# Patient Record
Sex: Female | Born: 1935 | Race: White | Hispanic: No | Smoking: Never smoker
Health system: Southern US, Community
[De-identification: ages and names within clinical notes are randomized; demographics above are authoritative.]

## PROBLEM LIST (undated history)

## (undated) DIAGNOSIS — E119 Type 2 diabetes mellitus without complications: Secondary | ICD-10-CM

## (undated) DIAGNOSIS — I4891 Unspecified atrial fibrillation: Secondary | ICD-10-CM

## (undated) DIAGNOSIS — I639 Cerebral infarction, unspecified: Secondary | ICD-10-CM

---

## 2004-04-24 ENCOUNTER — Ambulatory Visit: Payer: Self-pay | Admitting: Internal Medicine

## 2004-05-22 ENCOUNTER — Ambulatory Visit: Payer: Self-pay | Admitting: Internal Medicine

## 2004-06-20 ENCOUNTER — Ambulatory Visit: Payer: Self-pay | Admitting: Unknown Physician Specialty

## 2005-02-13 ENCOUNTER — Ambulatory Visit: Payer: Self-pay | Admitting: Rheumatology

## 2005-12-02 ENCOUNTER — Ambulatory Visit: Payer: Self-pay | Admitting: Internal Medicine

## 2006-08-26 ENCOUNTER — Other Ambulatory Visit: Payer: Self-pay

## 2006-08-26 ENCOUNTER — Emergency Department: Payer: Self-pay | Admitting: Emergency Medicine

## 2006-08-28 ENCOUNTER — Ambulatory Visit: Payer: Self-pay | Admitting: Emergency Medicine

## 2006-08-28 ENCOUNTER — Ambulatory Visit: Payer: Self-pay | Admitting: Internal Medicine

## 2006-09-11 ENCOUNTER — Ambulatory Visit: Payer: Self-pay | Admitting: Neurology

## 2008-01-07 ENCOUNTER — Ambulatory Visit: Payer: Self-pay | Admitting: Ophthalmology

## 2008-04-15 ENCOUNTER — Other Ambulatory Visit: Payer: Self-pay

## 2008-04-15 ENCOUNTER — Inpatient Hospital Stay: Payer: Self-pay | Admitting: Internal Medicine

## 2008-04-27 ENCOUNTER — Ambulatory Visit: Payer: Self-pay | Admitting: Unknown Physician Specialty

## 2008-11-10 ENCOUNTER — Ambulatory Visit: Payer: Self-pay | Admitting: Unknown Physician Specialty

## 2009-01-25 ENCOUNTER — Ambulatory Visit: Payer: Self-pay | Admitting: Unknown Physician Specialty

## 2009-11-23 ENCOUNTER — Ambulatory Visit: Payer: Self-pay | Admitting: Unknown Physician Specialty

## 2010-01-25 ENCOUNTER — Inpatient Hospital Stay: Payer: Self-pay | Admitting: Specialist

## 2010-02-25 ENCOUNTER — Inpatient Hospital Stay: Payer: Self-pay | Admitting: Specialist

## 2010-08-22 ENCOUNTER — Ambulatory Visit: Payer: Self-pay | Admitting: Ophthalmology

## 2010-10-03 ENCOUNTER — Ambulatory Visit: Payer: Self-pay | Admitting: Ophthalmology

## 2010-10-23 IMAGING — CR DG CHEST 1V PORT
1 series · 1 of 1 positions shown · non-contrast
Comparison: none

REASON FOR EXAM: sob
COMMENTS:

PROCEDURE:     DXR - DXR PORTABLE CHEST SINGLE VIEW  - February 25, 2010 [DATE]
RESULT:     Comparison: 01/25/2010

[view not recorded]
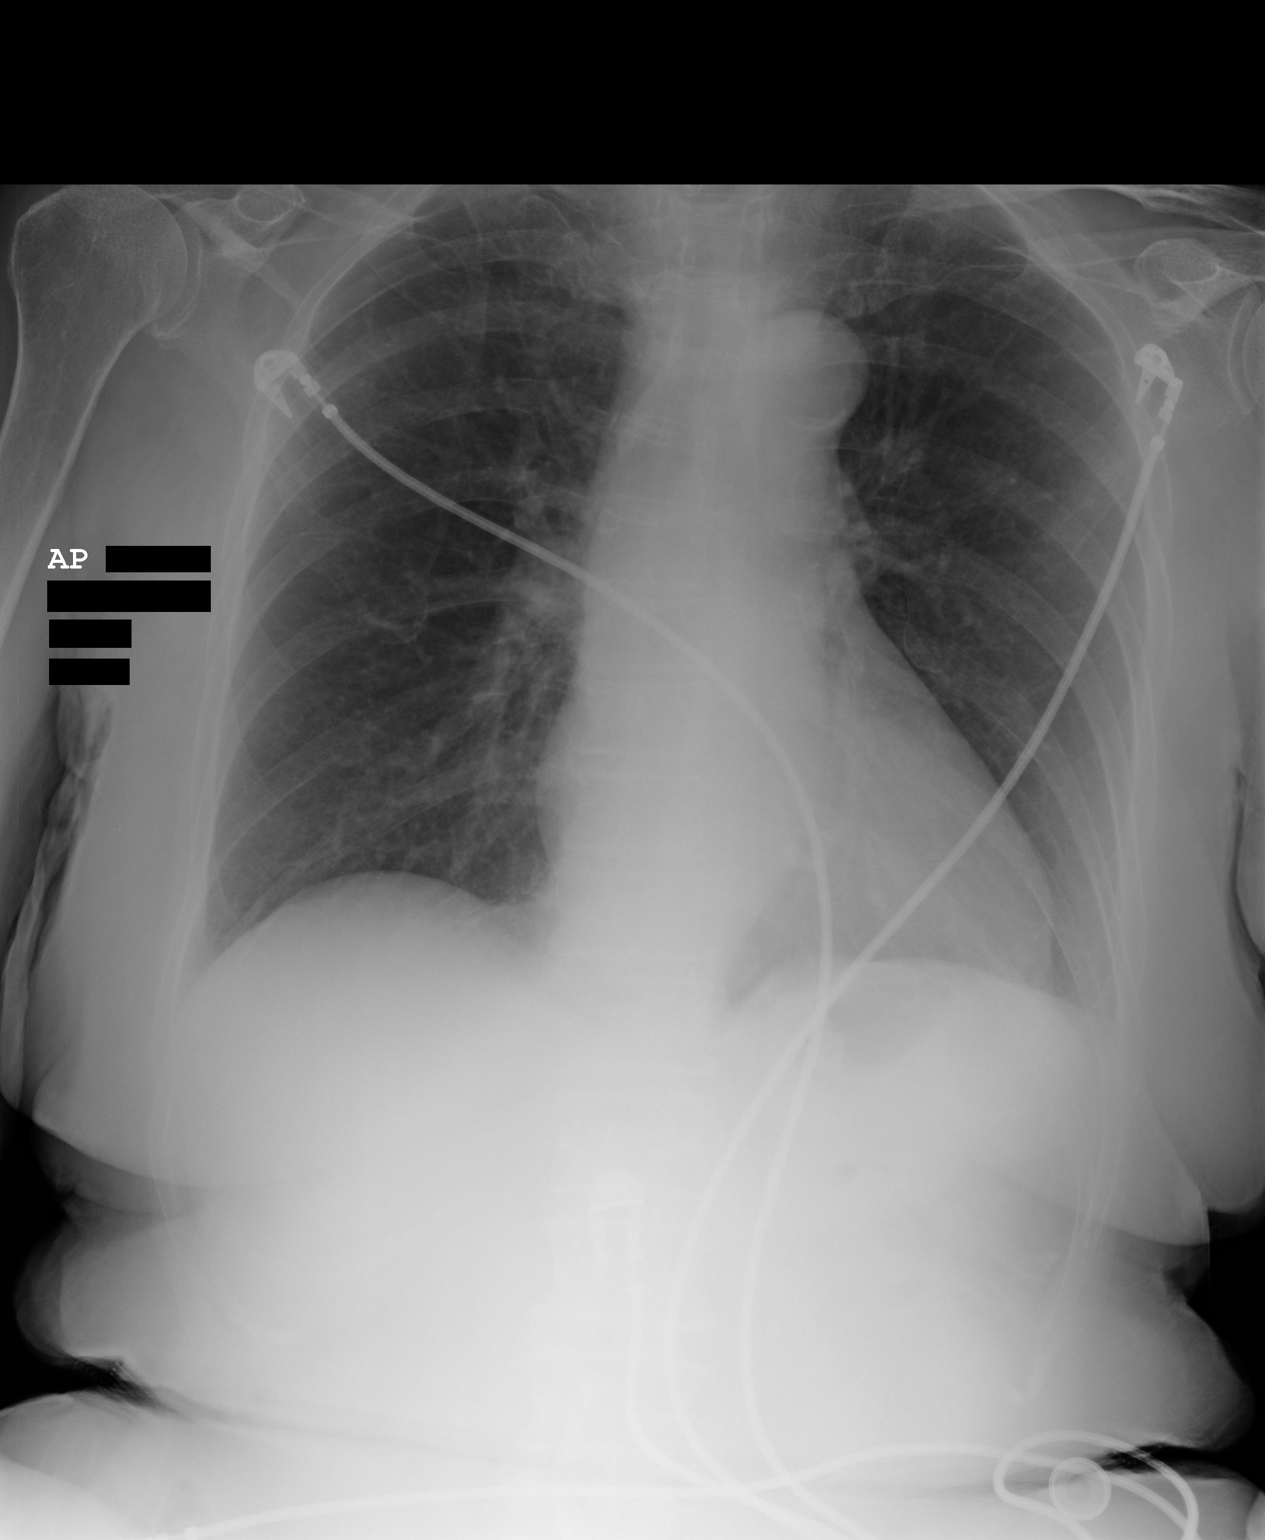

[1 of 1 positions shown; findings below may reference images not displayed]

FINDINGS: Single portable AP chest radiograph is provided. There is no focal
parenchymal opacity, pleural effusion, or pneumothorax. Normal
cardiomediastinal silhouette. The osseous structures are unremarkable.
IMPRESSION: No acute disease of the chest.

## 2010-10-23 IMAGING — CT CT HEAD WITHOUT CONTRAST
2 series · 15 of 30 positions shown, 19 images · non-contrast
Comparison: none

REASON FOR EXAM: dizziness
COMMENTS:

PROCEDURE:     CT  - CT HEAD WITHOUT CONTRAST  - February 25, 2010 [DATE]
RESULT:     Comparison:  01/25/2010, 08/26/2006
TECHNIQUE: Multiple axial images from the foramen magnum to the vertex were
obtained without IV contrast.

[Series 2: without · axial · non-contrast · 0.40mm/px · z∈[+1087,+1207]mm · 13 of 28 slices shown, 17 images]
[im 2/28  brain]
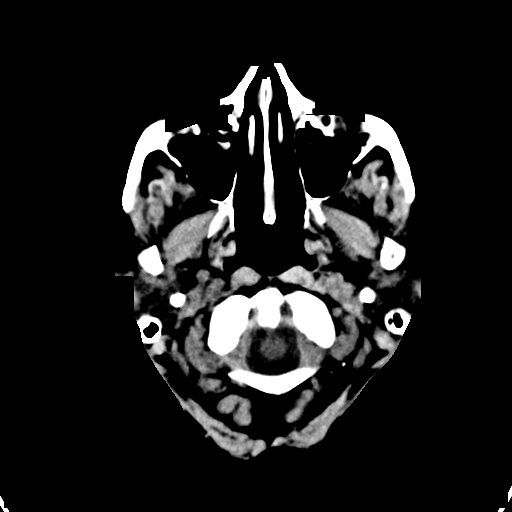
[im 2/28  bone]
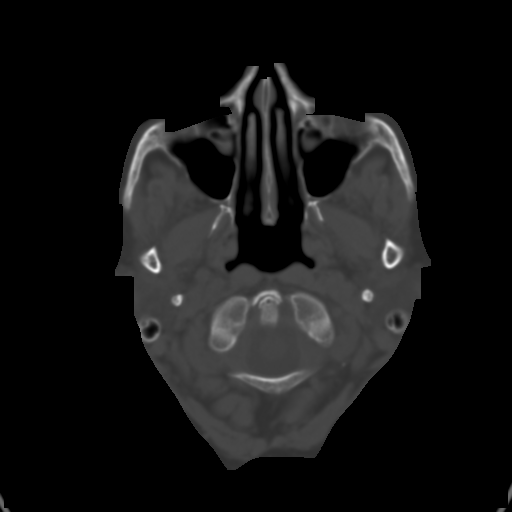
[im 4/28  brain]
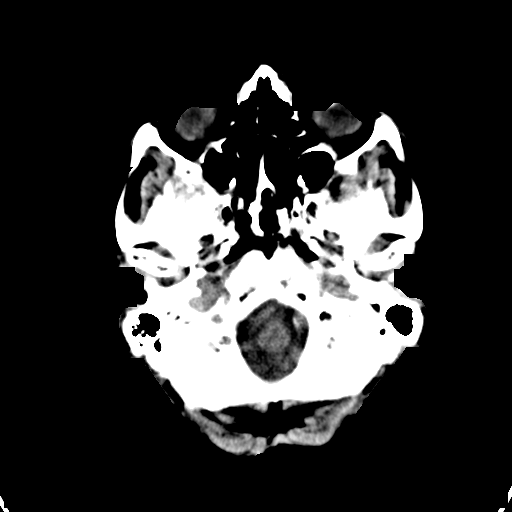
[im 6/28  brain]
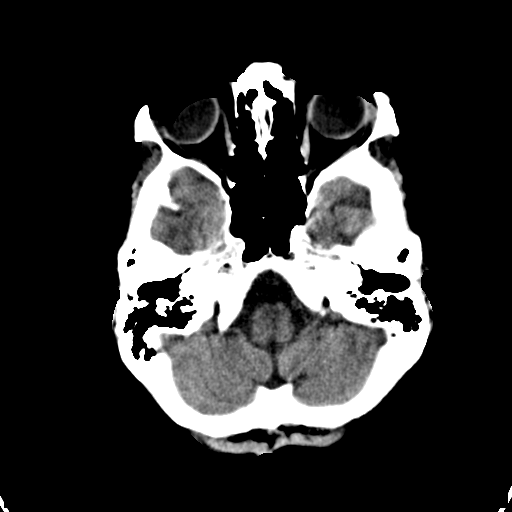
[im 8/28  brain]
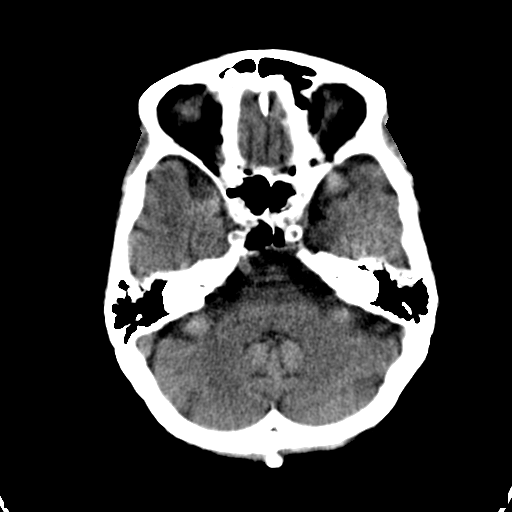
[im 10/28  brain]
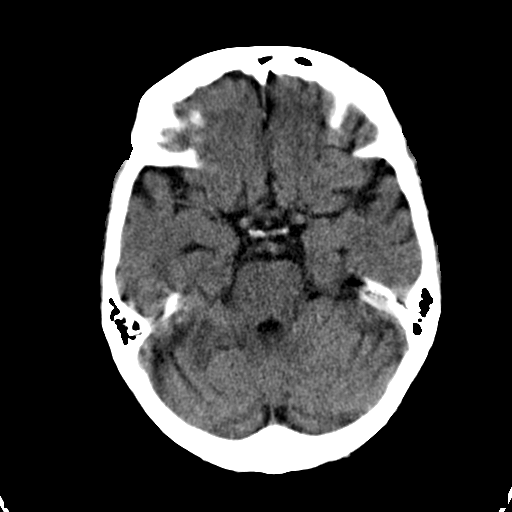
[im 10/28  bone]
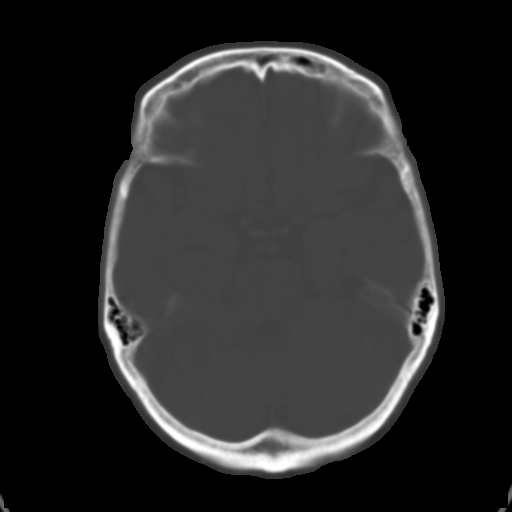
[im 12/28  brain]
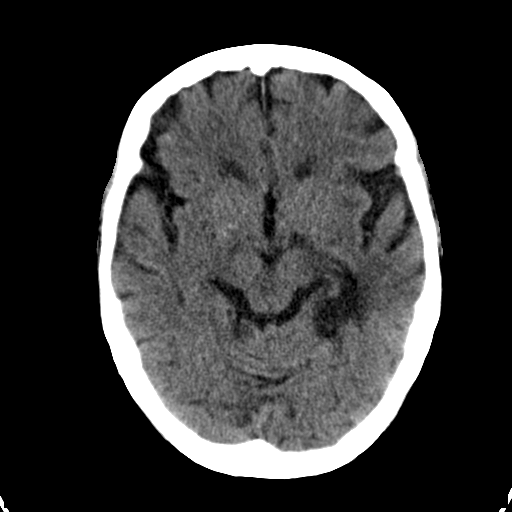
[im 14/28  brain]
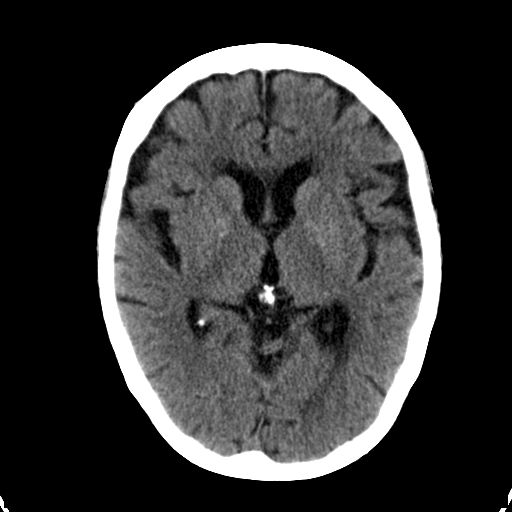
[im 16/28  brain]
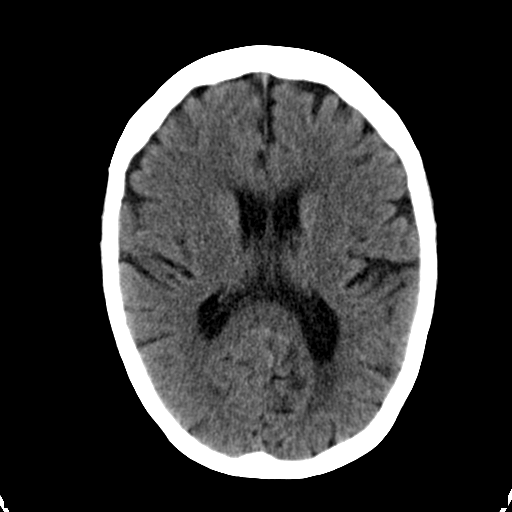
[im 18/28  brain]
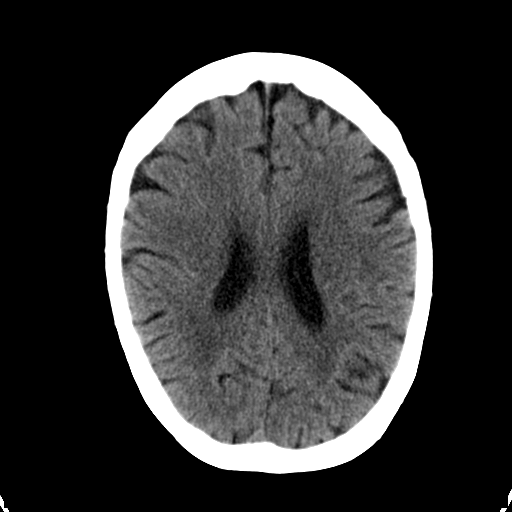
[im 18/28  bone]
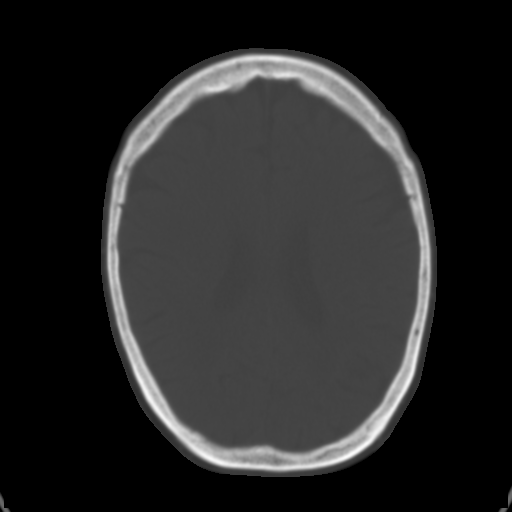
[im 20/28  brain]
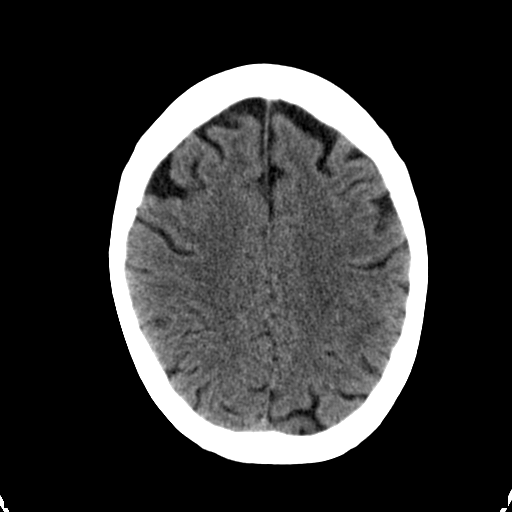
[im 22/28  brain]
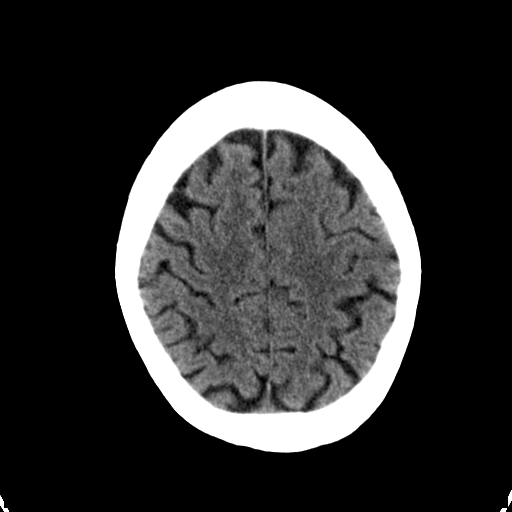
[im 24/28  brain]
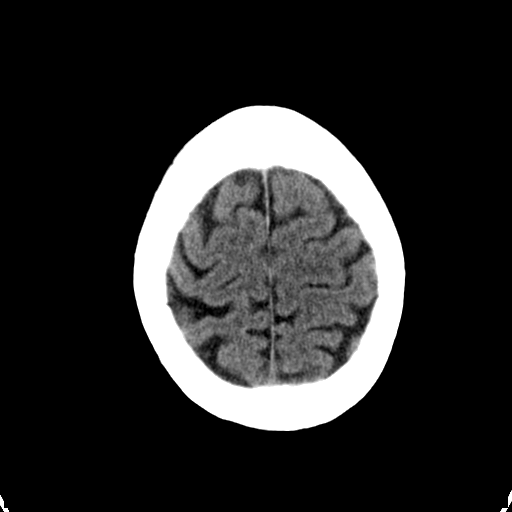
[im 26/28  brain]
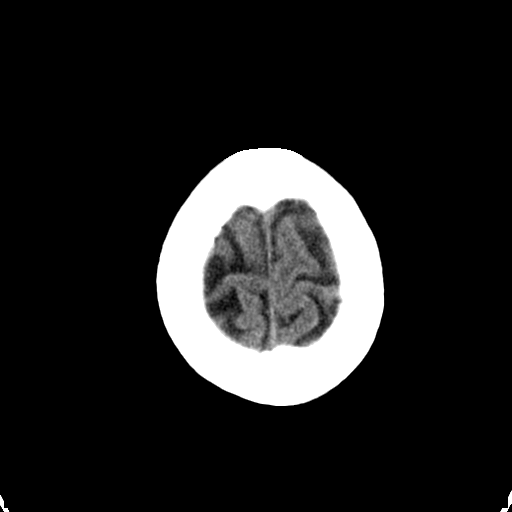
[im 26/28  bone]
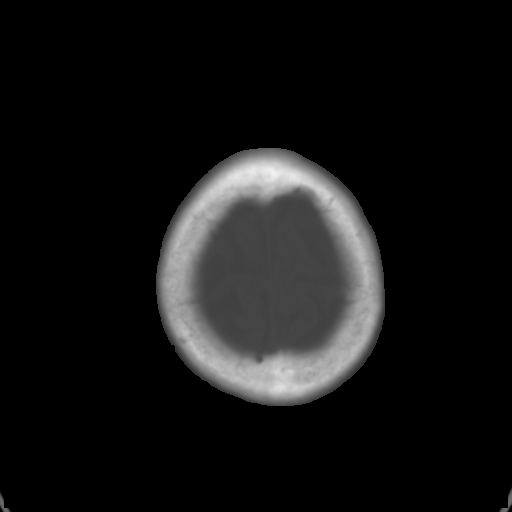

[Series 3: bone · axial · 0.40mm/px · z∈[+1087,+1107]mm · 2 of 28 slices shown]
[im 2/28  bone]
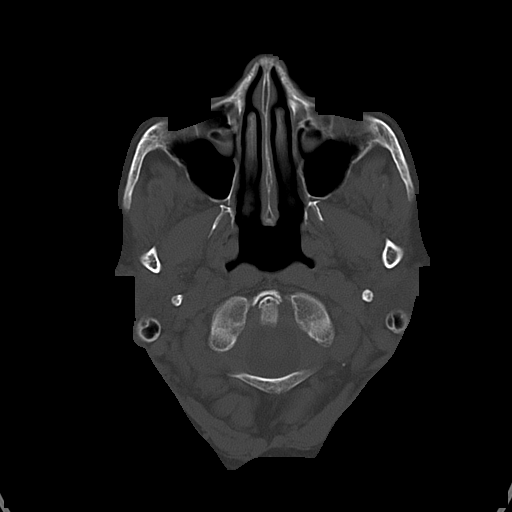
[im 6/28  bone]
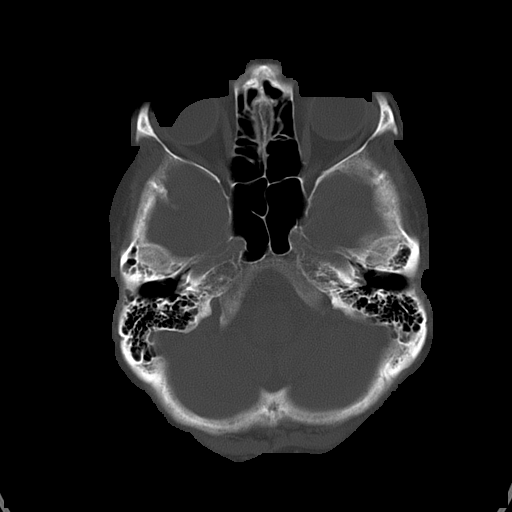

[15 of 30 positions shown; findings below may reference images not displayed]

FINDINGS: There is no evidence of mass effect, midline shift, or extra-axial fluid
collections.  There is no evidence of a space-occupying lesion or
intracranial hemorrhage. There is no evidence of a cortical-based area of
acute infarction. There is generalized cerebral atrophy. There is
periventricular white matter low attenuation likely secondary to
microangiopathy.

The ventricles and sulci are appropriate for the patient's age. The basal
cisterns are patent.

Visualized portions of the orbits are unremarkable. The visualized portions
of the paranasal sinuses and mastoid air cells are unremarkable.
Cerebrovascular atherosclerotic calcifications are noted.

The osseous structures are unremarkable.
IMPRESSION: No acute intracranial process.

## 2011-03-23 ENCOUNTER — Ambulatory Visit: Payer: Self-pay | Admitting: Internal Medicine

## 2012-07-11 ENCOUNTER — Inpatient Hospital Stay: Payer: Self-pay | Admitting: Internal Medicine

## 2012-07-11 LAB — CBC
HCT: 36.9 % (ref 35.0–47.0)
HGB: 12.5 g/dL (ref 12.0–16.0)
MCH: 29 pg (ref 26.0–34.0)
MCHC: 33.8 g/dL (ref 32.0–36.0)
MCV: 86 fL (ref 80–100)
Platelet: 229 10*3/uL (ref 150–440)
RBC: 4.3 10*6/uL (ref 3.80–5.20)
RDW: 13.6 % (ref 11.5–14.5)

## 2012-07-11 LAB — BASIC METABOLIC PANEL
Anion Gap: 9 (ref 7–16)
BUN: 13 mg/dL (ref 7–18)
Creatinine: 0.82 mg/dL (ref 0.60–1.30)
EGFR (African American): >60
EGFR (Non-African Amer.): >60
Sodium: 140 mmol/L (ref 136–145)

## 2012-07-11 LAB — PROTIME-INR: INR: 0.9

## 2012-07-11 LAB — TROPONIN I: Troponin-I: 0.1 ng/mL — ABNORMAL HIGH

## 2012-07-12 LAB — TROPONIN I: Troponin-I: 4.7 ng/mL — ABNORMAL HIGH

## 2012-07-12 LAB — CBC WITH DIFFERENTIAL/PLATELET
Basophil #: 0.1 10*3/uL (ref 0.0–0.1)
Eosinophil #: 0.2 10*3/uL (ref 0.0–0.7)
HCT: 32 % — ABNORMAL LOW (ref 35.0–47.0)
HGB: 10.8 g/dL — ABNORMAL LOW (ref 12.0–16.0)
Lymphocyte #: 2.4 10*3/uL (ref 1.0–3.6)
Lymphocyte %: 30.3 %
MCH: 28.7 pg (ref 26.0–34.0)
Monocyte #: 0.8 x10 3/mm (ref 0.2–0.9)
Neutrophil #: 4.5 10*3/uL (ref 1.4–6.5)
Neutrophil %: 55.9 %
Platelet: 209 10*3/uL (ref 150–440)
RDW: 13.9 % (ref 11.5–14.5)
WBC: 8 10*3/uL (ref 3.6–11.0)

## 2012-07-12 LAB — CK TOTAL AND CKMB (NOT AT ARMC)
CK, Total: 305 U/L — ABNORMAL HIGH (ref 21–215)
CK-MB: 32 ng/mL — ABNORMAL HIGH (ref 0.5–3.6)

## 2012-07-12 LAB — BASIC METABOLIC PANEL
BUN: 15 mg/dL (ref 7–18)
Co2: 26 mmol/L (ref 21–32)
Creatinine: 0.88 mg/dL (ref 0.60–1.30)
EGFR (African American): >60
EGFR (Non-African Amer.): >60
Potassium: 3.5 mmol/L (ref 3.5–5.1)
Sodium: 139 mmol/L (ref 136–145)

## 2012-07-13 LAB — BASIC METABOLIC PANEL
Anion Gap: 10 (ref 7–16)
BUN: 9 mg/dL (ref 7–18)
Calcium, Total: 8.2 mg/dL — ABNORMAL LOW (ref 8.5–10.1)
Co2: 27 mmol/L (ref 21–32)
EGFR (African American): >60
EGFR (Non-African Amer.): >60
Glucose: 130 mg/dL — ABNORMAL HIGH (ref 65–99)
Osmolality: 276 (ref 275–301)
Potassium: 3.3 mmol/L — ABNORMAL LOW (ref 3.5–5.1)

## 2012-07-13 LAB — CBC
MCHC: 34.5 g/dL (ref 32.0–36.0)
RDW: 13.4 % (ref 11.5–14.5)
WBC: 9.2 10*3/uL (ref 3.6–11.0)

## 2012-07-13 LAB — PROTIME-INR: INR: 1

## 2012-09-11 ENCOUNTER — Ambulatory Visit: Payer: Self-pay | Admitting: Nurse Practitioner

## 2013-07-28 ENCOUNTER — Emergency Department: Payer: Self-pay

## 2013-07-28 LAB — CBC WITH DIFFERENTIAL/PLATELET
Basophil #: 0.2 10*3/uL — ABNORMAL HIGH (ref 0.0–0.1)
Basophil %: 1 %
EOS PCT: 0.3 %
Eosinophil #: 0 10*3/uL (ref 0.0–0.7)
HCT: 38.3 % (ref 35.0–47.0)
HGB: 12.5 g/dL (ref 12.0–16.0)
Lymphocyte #: 1.3 10*3/uL (ref 1.0–3.6)
Lymphocyte %: 8.5 %
MCH: 28 pg (ref 26.0–34.0)
MCHC: 32.6 g/dL (ref 32.0–36.0)
MCV: 86 fL (ref 80–100)
MONOS PCT: 4.5 %
Monocyte #: 0.7 x10 3/mm (ref 0.2–0.9)
Neutrophil #: 13.2 10*3/uL — ABNORMAL HIGH (ref 1.4–6.5)
Neutrophil %: 85.7 %
PLATELETS: 175 10*3/uL (ref 150–440)
RBC: 4.44 10*6/uL (ref 3.80–5.20)
RDW: 13.9 % (ref 11.5–14.5)
WBC: 15.4 10*3/uL — ABNORMAL HIGH (ref 3.6–11.0)

## 2013-07-28 LAB — COMPREHENSIVE METABOLIC PANEL
ALBUMIN: 3.7 g/dL (ref 3.4–5.0)
ALK PHOS: 54 U/L
ANION GAP: 12 (ref 7–16)
BUN: 20 mg/dL — ABNORMAL HIGH (ref 7–18)
Bilirubin,Total: 0.3 mg/dL (ref 0.2–1.0)
CALCIUM: 9.1 mg/dL (ref 8.5–10.1)
CO2: 20 mmol/L — AB (ref 21–32)
Chloride: 101 mmol/L (ref 98–107)
Creatinine: 1.15 mg/dL (ref 0.60–1.30)
EGFR (African American): 52 — ABNORMAL LOW
EGFR (Non-African Amer.): 45 — ABNORMAL LOW
Glucose: 170 mg/dL — ABNORMAL HIGH (ref 65–99)
OSMOLALITY: 273 (ref 275–301)
POTASSIUM: 3.4 mmol/L — AB (ref 3.5–5.1)
SGOT(AST): 19 U/L (ref 15–37)
SGPT (ALT): 13 U/L (ref 12–78)
Sodium: 133 mmol/L — ABNORMAL LOW (ref 136–145)
TOTAL PROTEIN: 7.7 g/dL (ref 6.4–8.2)

## 2013-07-28 LAB — PROTIME-INR
INR: 1.1
Prothrombin Time: 14 secs (ref 11.5–14.7)

## 2013-07-28 LAB — URINALYSIS, COMPLETE
BILIRUBIN, UR: NEGATIVE
Bacteria: NONE SEEN
Glucose,UR: 50 mg/dL (ref 0–75)
NITRITE: NEGATIVE
Ph: 5 (ref 4.5–8.0)
RBC,UR: 11 /HPF (ref 0–5)
SPECIFIC GRAVITY: 1.018 (ref 1.003–1.030)
Squamous Epithelial: 1

## 2013-07-28 LAB — TROPONIN I: Troponin-I: <0.02 ng/mL

## 2013-07-28 LAB — APTT: Activated PTT: <23 secs — ABNORMAL LOW (ref 23.6–35.9)

## 2013-11-26 ENCOUNTER — Ambulatory Visit: Payer: Self-pay | Admitting: Unknown Physician Specialty

## 2014-10-17 LAB — URINALYSIS, COMPLETE
Bacteria: NONE SEEN
Bilirubin,UR: NEGATIVE
LEUKOCYTE ESTERASE: NEGATIVE
NITRITE: NEGATIVE
Ph: 5 (ref 4.5–8.0)
Protein: >=500
Specific Gravity: 1.024 (ref 1.003–1.030)
Squamous Epithelial: 1
WBC UR: NONE SEEN /HPF (ref 0–5)

## 2014-10-17 LAB — BASIC METABOLIC PANEL
Anion Gap: 14 (ref 7–16)
BUN: 25 mg/dL — AB
CHLORIDE: 99 mmol/L — AB
CREATININE: 0.64 mg/dL
Calcium, Total: 8.8 mg/dL — ABNORMAL LOW
Co2: 23 mmol/L
GLUCOSE: 202 mg/dL — AB
POTASSIUM: 3.1 mmol/L — AB
SODIUM: 136 mmol/L

## 2014-10-17 LAB — CBC
HCT: 40.1 % (ref 35.0–47.0)
HGB: 12.9 g/dL (ref 12.0–16.0)
MCH: 28.1 pg (ref 26.0–34.0)
MCHC: 32.2 g/dL (ref 32.0–36.0)
MCV: 87 fL (ref 80–100)
Platelet: 192 10*3/uL (ref 150–440)
RBC: 4.6 10*6/uL (ref 3.80–5.20)
RDW: 13.6 % (ref 11.5–14.5)
WBC: 16 10*3/uL — ABNORMAL HIGH (ref 3.6–11.0)

## 2014-10-17 LAB — TROPONIN I: Troponin-I: 0.1 ng/mL — ABNORMAL HIGH

## 2014-10-17 LAB — CK: CK, TOTAL: 556 U/L — AB

## 2014-10-18 ENCOUNTER — Inpatient Hospital Stay
Admit: 2014-10-18 | Disposition: A | Discharge status: Skilled Nursing Facility | Payer: Self-pay | Attending: Internal Medicine | Admitting: Internal Medicine

## 2014-10-18 LAB — MAGNESIUM: Magnesium: 1.3 mg/dL — ABNORMAL LOW

## 2014-10-18 LAB — TROPONIN I
TROPONIN-I: 0.04 ng/mL — AB
Troponin-I: 0.07 ng/mL — ABNORMAL HIGH

## 2014-10-19 LAB — CBC WITH DIFFERENTIAL/PLATELET
Basophil #: 0.1 10*3/uL (ref 0.0–0.1)
Basophil %: 0.9 %
EOS PCT: 2.8 %
Eosinophil #: 0.3 10*3/uL (ref 0.0–0.7)
HCT: 37.2 % (ref 35.0–47.0)
HGB: 12.4 g/dL (ref 12.0–16.0)
Lymphocyte #: 2.1 10*3/uL (ref 1.0–3.6)
Lymphocyte %: 19.3 %
MCH: 28.8 pg (ref 26.0–34.0)
MCHC: 33.4 g/dL (ref 32.0–36.0)
MCV: 86 fL (ref 80–100)
MONO ABS: 1.2 x10 3/mm — AB (ref 0.2–0.9)
MONOS PCT: 10.9 %
Neutrophil #: 7.2 10*3/uL — ABNORMAL HIGH (ref 1.4–6.5)
Neutrophil %: 66.1 %
Platelet: 180 10*3/uL (ref 150–440)
RBC: 4.31 10*6/uL (ref 3.80–5.20)
RDW: 13.8 % (ref 11.5–14.5)
WBC: 10.9 10*3/uL (ref 3.6–11.0)

## 2014-10-19 LAB — BASIC METABOLIC PANEL
ANION GAP: 6 — AB (ref 7–16)
BUN: 18 mg/dL
CHLORIDE: 104 mmol/L
Calcium, Total: 8.4 mg/dL — ABNORMAL LOW
Co2: 28 mmol/L
Creatinine: 0.6 mg/dL
Glucose: 156 mg/dL — ABNORMAL HIGH
Potassium: 3.4 mmol/L — ABNORMAL LOW
Sodium: 138 mmol/L

## 2014-11-08 NOTE — Consult Note (Signed)
PATIENT NAME:  Desiree Roberts, Desiree Roberts MR#:  [id number] DATE OF BIRTH:  1931-09-18  DATE OF CONSULTATION:  07/12/2012  REFERRING PHYSICIAN:   CONSULTING PHYSICIAN:  Alois Colgan K. Xzavian Semmel, MD  HISTORY OF PRESENT ILLNESS: The patient is a 81 year old white female retired from an [employer]. The patient is widowed for 9 years and lives by herself in an apartment. Chief complaint, "I came here for my heart problems."  Staff report that the patient is scheduled for cardiac catheterization in a.m. of 07/13/2012.   PAST PSYCHIATRIC HISTORY:  No previous history of inpatient hospitalization to psychiatry. No history of suicide attempts. Not being followed by any psychiatrist at this time.   ALCOHOL AND DRUGS: Denied.  MENTAL STATUS: Alert and oriented to place, person and time. Denies feeling depressed. Denies feeling hopeless or helpless. Admits feeling tired. Admits that she had just had morphine and feels tired.  No psychosis. Does admit to insomnia and she does not get more than two to three hours of sleep at night and denies taking any naps during the day. Denies suicidal or homicidal  plans. Insight and judgment fair. The patient did not want to participate much with cognitive testing as she reports that she is tired, but she could spell the word world forward.   IMPRESSION: Insomnia, rule out primary insomnia.  RECOMMENDATION: Klonopin 1 mg p.o. at bedtime should help the patient rest well and you can try that. Discontinue Restoril. In fact several other medications like Sinequan 25 mg p.o. at bedtime should also help her rest well. Seroquel 50 mg at bedtime could be considered to help her rest.  ____________________________ Jannet MantisSurya K. Guss Bundehalla, MD skc:sb D: 07/12/2012 21:45:19 ET     T: 07/13/2012 10:35:31 ET       JOB#: [id number] cc: Monika SalkSurya K. Guss Bundehalla, MD, <Dictator> Beau FannySURYA K Lawson Mahone MD ELECTRONICALLY SIGNED 07/16/2012 19:23

## 2014-11-08 NOTE — Discharge Summary (Signed)
PATIENT NAME:  Desiree Roberts, Desiree Roberts MR#:  [id number] DATE OF BIRTH:  03/04/1932  DATE OF ADMISSION:  07/11/2012 DATE OF DISCHARGE:  07/13/2012  PRIMARY CARDIOLOGIST: Dr. Gwen PoundsKowalski.   DISCHARGE DIAGNOSES:  1. Non-ST-elevation myocardial infarction. 2. Atrial fibrillation.  3. Hypertension.  4. Diabetes mellitus.   CONSULTS: Dr. Lady GaryFath of Cardiology.   PROCEDURES: Cardiac catheterization revealed distal RCA occlusion, but no proximal coronary artery disease.   ADMITTING HISTORY AND PHYSICAL: Please see detailed H and P dictated by Dr. Mordecai MaesSanchez on 07/11/2012. In brief, a 81 year old female patient with history of hypertension, hyperlipidemia, diabetes, atrial fibrillation who presented to the Emergency Room complaining of central chest pain. The patient was admitted to the hospitalist service to rule out acute coronary syndrome after a mild elevation in troponin.   HOSPITAL COURSE: 1. Chest pain. The patient had elevated troponin, but this trended up, up to 16 with her third set of cardiac enzymes. The patient had a cardiac cath done which showed complete occlusion of distal RCA with no proximal coronary artery disease. On further discussion with Dr. Lady GaryFath, it was thought likely secondary from atrial fibrillation. The patient is being started on aspirin, statin, beta blocker, and Coumadin for her atrial fibrillation.  2. Atrial fibrillation. The patient had a high CHADS score with prior history of TIAs and has been started on Coumadin. She will follow up with Dr. Gwen PoundsKowalski for regulation of her INR between 2 to 3.   On the day of discharge, the patient does not have any further chest pain, tolerated her cath well, blood pressure is 128/86, pulse 80, saturating 94% on room air, and is being discharged home in a fair condition to follow up with Dr. Gwen PoundsKowalski.   DISCHARGE MEDICATIONS: Include: 1. Metformin 1,000 mg oral once a day and 1-1/2 tablets in the evening to be started on 07/15/2012.  2. Metoprolol  succinate 25 mg oral once a day.  3. Warfarin 4 mg oral once a day.  4. Simvastatin 40 mg oral once a day.  5. Aspirin 81 mg oral once a day.   DISCHARGE INSTRUCTIONS: The patient will be on a diabetic diet. Activity as tolerated.  Follow up with Dr. Gwen PoundsKowalski in 1 to 2 weeks. The patient has been given a prescription to check her PT/INR in three days and results shall be cc'd to Dr. Gwen PoundsKowalski.   This plan was discussed with the patient and her grandson at bedside who verbalized understanding and are okay with the plan.   Time spent today on this discharge dictation along with coordinating care and counseling of the patient was 40 minutes.    ____________________________ Molinda BailiffSrikar R. Jeramey Lanuza, MD srs:es D: 07/13/2012 15:13:46 ET T: 07/13/2012 15:54:44 ET JOB#: [id number]  cc: Wardell HeathSrikar R. Elpidio AnisSudini, MD, <Dictator> Lamar BlinksBruce J. Kowalski, MD Orie FishermanSRIKAR R Aly Seidenberg MD ELECTRONICALLY SIGNED 07/16/2012 14:04

## 2014-11-08 NOTE — H&P (Signed)
PATIENT NAME:  Desiree Roberts, Desiree Roberts MR#:  [id number] DATE OF BIRTH:  1931/11/30  DATE OF ADMISSION:  07/11/2012  REFERRING PHYSICIAN:  Venita Lick, MD  PRIMARY CARE PHYSICIAN:  None local.  PRIMARY CARDIOLOGIST: Arnoldo Hooker, MD.   CHIEF COMPLAINT: Chest pain.   HISTORY OF PRESENT ILLNESS: The patient is a very nice 81 year old female who has history of hypertension, hyperlipidemia, diabetes, previous CVA x 3, the last one a year ago, positive atrial fibrillation who comes with a history of chest pain that started 5:00 p.m. while she was sitting up, not doing any activity prior to that. The pain was located retrosternally. She says it was right there in the middle of the chest 6 out of 10.  At that moment she might have had some slight increase in shortness of breath. She states that she always has some shortness of breath but it  was worse today. She denies any sweating, any fever and she thought that it might have been indigestion for what she made herself throw up once. At this moment the patient still feels some of the same sensation. She rated it at 3 to 4 out of 10. She says that it has not gone away.  She does not have any history of previous heart attacks or heart disease other than the usual fibrillation and she has never had a pain like this before.  Overall, she has been doing her normal state of health until now.   The patient is a very poor historian, by the way, and it was very difficult to get all the information from her.  Her son and her daughter-in-law are here today. We have been having trouble trying to figure out her medications. They are going to be trying to get  them tonight.   REVIEW OF SYSTEMS:  CONSTITUTIONAL: Denies any fever, fatigue, significant weakness. She states that today she got winded going to the store.  The son states that the patient goes 5 times a week to the store and returns without any groceries, empty-handed. The patient denies any forgetfulness or memory  loss. Denies any depression. Denies any sadness, but she feels like she might be a little bit depressed. She states that she had some weight loss but it is difficult to evaluate how much. She use goes on and on with unrelated issues, and so far she is still wearing her same clothes and it has not changed on sizes.  The patient states she has significant loss of appetite. The patient also only sleeps 2 hours every night and this has been going on for several months.  EYES: No blurry vision. No double vision. No glaucoma. No cataracts.  ENT: No tinnitus. No sinus pain. No difficulty swallowing.  RESPIRATORY: No cough. No wheezing. No hemoptysis. No COPD.  CARDIOVASCULAR: Positive chest pain. Negative for orthopnea, edema, arrhythmias, palpitations, or syncope.  She used to have significant palpitations whenever she was going in and out of atrial fibrillation but now she is used to them and does not feel them anymore. GASTROINTESTINAL: No nausea, vomiting or diarrhea. The patient might have some indigestion. No constipation. No blood in the stool. No melena.  GENITOURINARY: No dysuria or hematuria. No frequency or incontinence, no breast masses.  ENDOCRINE: No polyuria, polydipsia, or polyphagia. No cold or heat intolerance.  HEMATOLOGIC/LYMPHATIC:  No anemia, easy bruising or bleeding.  SKIN: No significant lesions or rashes.  MUSCULOSKELETAL: No neck pain, back pain, shoulder pain, no gout.  NEUROLOGIC: No numbness, tingling,  no weakness. The patient had 3 CVAs and TIAs in the past.  PSYCHIATRIC: Positive for depression right now.  PAST MEDICAL HISTORY: 1.  Hypertension.  2.  Type 2 diabetes, non-insulin-dependent.  3.  Hyperlipidemia.  4.  History of CVA x 3.  5.  Atrial fibrillation.   PAST SURGICAL HISTORY: 1.  Appendectomy.  2.  Tonsillectomy.  3.  Tubal ligation.  4.  Removal of breast cyst.  ALLERGIES:  No known drug allergies.   MEDICATIONS:  Unable to address at this moment.  The  patient does not have her medications, does not even know what she is taking.  She knows that she is on a blood thinner, does not know the name. I called the pharmacy that she goes to, [facility] on [address] and they closed at 6.  We asked the family to go bring her medications. We have on the chart from previous admission aspirin 325, Zocor 40, metformin 500 twice daily, simvastatin 40, Altace 2.5 and atenolol 50 mg.   FAMILY HISTORY: The patient has no history of heart disease. No history of diabetes. No history of cancer as far as she can tell.   SOCIAL HISTORY:  The patient is retired from [employer]. She does not smoke, does not drink, does not use drugs. She lives by herself and she has been independent, although there might be some changes in her mammary and behavior lately.   PHYSICAL EXAMINATION: VITAL SIGNS: Blood pressure 128/89, pulse 87, respiratory rate 16, temperature 98.  GENERAL: The patient is alert, oriented x 3, in no acute distress. No respiratory distress. Hemodynamically stable.  HEENT: Pupils are equal and reactive. Extraocular movements are intact. Mucosa moist. No oropharyngeal exudates. No oral lesions.  NECK: Supple. No JVD. No thyromegaly. No adenopathy. No carotid bruits. No rigidity.  CARDIOVASCULAR: Irregularly irregular, no murmurs, rubs, or gallops. No displacement of PMI. Positive mild tenderness to palpation of her anterior chest wall and pressure on the chest.  LUNGS:  Clear without any wheezing or crepitus. No dullness to percussion. No use of accessory muscles.  ABDOMEN:  Soft, nontender, nondistended. No hepatosplenomegaly. No masses. Bowel sounds are positive.  GENITAL:  Deferred.  EXTREMITIES:  No edema, no cyanosis, no clubbing. Pulses +2.  NEUROLOGIC:  Cranial nerves II through XII intact. No focal abnormalities.  PSYCHIATRIC: The patient has a very flat affect. No agitation. She is oriented x 3 but her train of thought is very de-organized.  SKIN:   Without any rashes or petechiae, normal turgor.  LYMPHATICS:  Negative for lymphadenopathy in neck or supraclavicular areas.  MUSCULOSKELETAL:  No joint deformity. No pain in the back.   LABORATORY AND DIAGNOSTIC DATA:  Troponin is 0.10.  Electrolytes within normal limits.  Glucose 180.  LFTs not checked.  White count is 8.3, hemoglobin 12, INR 0.9.  Chest x-ray:  No acute abnormalities.  ASSESSMENT AND PLAN:  50.  A 81 year old female with history of chest pain that started only today, atypical in nature for some degree of possible gastrointestinal upset. We are going to give her a gastrointestinal cocktail.  I am going to put her on a proton pump inhibitor.  Because of her elevated troponin, it is are barely elevated, we are going to check cardiac markers every 8 hours and give her Lovenox 1 mg/kg.  Apparently the patient is on a blood pain but her INR 0.9 so we are trying to figure out what medications she is taking and if she is taking  them and go from there, but at this moment, it is safe to anticoagulate her with Lovenox at 1 mg/kg every 12 hours. If her troponins are negative in the morning or stable, we are going to discontinue this medication.  I am going to give her aspirin 81 mg a day and I am going to put her on a beta blocker.  Previous to this admission, she was taking metoprolol.  This is an old list of medications; as I mentioned above, we do not have the real list of medications. She does not even know what medications she is taking for what.  I am just going to choose the previous dose that she was on give it to her.  She is hemodynamically stable.  Cardiology consultation with Dr. Gwen PoundsKowalski tomorrow morning, not urgently.  2.  Atrial fibrillation. The patient has atrial fibrillation, her rate is in the 90s.  The elevation of troponin could be just due to demand ischemia versus a troponin leak. Her creatinine is normal, although we are going to follow up on this patient.  If she is not on a  blood thinner, she probably should because she really had 3 strokes and has atrial fibrillation and high blood pressure and diabetes which give her a high ItalyHAD score.  I am concerned about the patient's possible mental decline that she might be forgetting to take some of her medications.  We are  going to try to try to evaluate this.  I am going to ask for a psychiatric consultation also to evaluate her for her possible depression.  3.  As far as her diabetes, I am going to put her on insulin sliding scale until we get her medications   4.  As far as her other medical problems, they seem to be stable. Her blood pressure is stable. Continue the previous medications she was taking for hyperlipidemia, which was Zocor and change it for her new medication whenever she bring her list. 5.  Deep venous thrombosis  prophylaxis with Lovenox, gastrointestinal prophylaxis Protonix. \  CODE STATUS:  The patient is a full code.  TIME SPENT: I spent about 50 minutes with this admission.    ____________________________ Felipa Furnaceoberto Sanchez Gutierrez, MD rsg:ct D: 07/11/2012 21:55:00 ET T: 07/12/2012 11:27:57 ET JOB#: [id number]  cc: Felipa Furnaceoberto Sanchez Gutierrez, MD, <Dictator> Pearlena Ow Juanda ChanceSANCHEZ GUTIERRE MD ELECTRONICALLY SIGNED 07/21/2012 11:04

## 2014-11-11 NOTE — Consult Note (Signed)
PATIENT NAME:  Desiree Roberts, Desiree G MR#:  [id number] DATE OF BIRTH:  [birth year]  DATE OF CONSULTATION:  07/12/2012  REFERRING PHYSICIAN:  Silver HugueninAileen Miller, MD CONSULTING PHYSICIAN:  Mazell Aylesworth D. Juliann Paresallwood, MD  CARDIOLOGIST: Dr. Gwen PoundsKowalski.  INDICATION: Chest pain and angina. She was referred by primary doctor.  HISTORY OF PRESENT ILLNESS: The patient is a 81 year old white female with history of chest pain. She has had angina in the past, what looks like early dementia, hypertension, atrial fibrillation and diabetes, who presents with elevated troponin and chest pain. The patient initially had borderline elevated troponin up to 4, but subsequently rose to 16 and cardiology consultation was recommended. The patient was recommend for cardiac catheterization, but was reluctant to do it. With history of chest pain, angina, diabetes, hyperlipidemia and now elevated troponin, cardiology was consult was recommended for possible cardiac catheterization. No blackout spells or syncope. No nausea or vomiting. Denies fever, chills or sweats. No weight loss. No weight gain. No hemoptysis or hematemesis. Denies bright red blood per rectum.   PAST MEDICAL HISTORY: Hypertension, hyperlipidemia, diabetes, mild dementia, atrial fibrillation and multiple falls.   FAMILY HISTORY: Positive for hypertension.   SOCIAL HISTORY: Lives at home. Retired. No smoking or alcohol consumption.   MEDICATIONS: She is on Ramipril once a day, metoprolol twice a day. She is also on a statin for cholesterol. She is on aspirin. She is not on Coumadin because of her fall risk. She is on diabetes medicine. She is on metformin 500 twice a day.   ALLERGIES: SHE STATES CODEINE MAKES HER SICK.  PHYSICAL EXAMINATION:  VITAL SIGNS: Blood pressure 110/70, pulse 70, respiratory rate 18, afebrile.  HEENT: Normocephalic, atraumatic. Pupils equal, reactive to light.  NECK: Supple. No JVD, bruits, or adenopathy.  LUNGS: Clear to auscultation. No wheezes,  rhonchi or rales. HEART: Rate is slightly irregular suggestive of atrial fibrillation.  ABDOMEN: Benign.  EXTREMITIES: Within normal limits.  NEUROLOGIC: Intact.  SKIN: Normal.  LABORATORIES: Glucose 151, BUN 15, creatinine 0.8, sodium 139, potassium 2.5. Troponin rose to 16 at peak. CK was 305, MB of 32. White count 8, hemoglobin and hematocrit 10.8 and 32, platelet count 209.   EKG: Atrial fibrillation, nonspecific ST-T wave changes.   ASSESSMENT: Non-Q-wave myocardial infarction, coronary artery disease, history of cerebrovascular accident, hypertension, diabetes, hyperlipidemia, dementia, atrial fibrillation and  anemia.   PLAN:  1.  Agree with admit. Follow up cardiac enzymes. Elevated troponin suggestive of non-Q-wave myocardial infarction. Continue laboratories. Followup further troponins to be sure it returns to baseline. Short-term anticoagulation with Lovenox or heparin. Continue aspirin, metoprolol, ACE inhibitor and statin. We will hold off on Integrilin at this point with her history of CVA and being over 77.  2.  Continue hypertension control with metoprolol and Ramipril.  3.  For CVA, continue aspirin therapy for now. 4.  Atrial fibrillation again, short-term anticoagulation, but she is not a good long-term anticoagulation risk because of multiple falls. Aspirin should be fine for now. We will consider adding Plavix or Aggrenox. 5.  For diabetes, continue metformin. We will hold metformin prior to cardiac catheterization. 6.  For lipids, continue statin therapy. Plan is to proceed with cardiac cath and forego functional study. Consider echocardiogram in the meantime. We need to assess the patient's coronary anatomy with this non-Q-wave myocardial infarction to make some determinations about long-term care.  7.  For anemia, we will continue to follow hemoglobin and hematocrit. Do not recommend transfusion at this point.     ____________________________  Jann Ra D. Juliann Pares,  MD ddc:aw D: 07/15/2012 23:16:13 ET T: 07/16/2012 04:09:40 ET JOB#: [id number]  cc: Brevyn Ring D. Juliann Pares, MD, <Dictator> Alwyn Pea MD ELECTRONICALLY SIGNED 08/06/2012 6:59

## 2014-11-20 NOTE — Consult Note (Signed)
Chief Complaint:  Subjective/Chief Complaint Pain left wrist and shoulder   VITAL SIGNS/ANCILLARY NOTES: **Vital Signs.:   30-Mar-16 11:11  Vital Signs Type Routine  Temperature Temperature (F) 98.3  Celsius 36.8  Temperature Source oral  Pulse Pulse 94  Respirations Respirations 20  Systolic BP Systolic BP 126  Diastolic BP (mmHg) Diastolic BP (mmHg) 76  Mean BP 92  Pulse Ox % Pulse Ox % 96  Pulse Ox Activity Level  At rest  Oxygen Delivery Room Air/ 21 %   Brief Assessment:  GEN well developed, well nourished   EXTR negative edema   Additional Physical Exam circulation/sensation/motor function good left arm and hand.  Pain with range of motion of both.   Assessment/Plan:  Assessment/Plan:  Assessment Left wrist and shoulder fractures.   Plan Procedure Note:  Left wrist prepped sterily and injected with 15cc 2% xylocaine.  Closed reduction and application of short arm cast performed with good positioning of wrist.  Sling reapplied.  Will ice and elevate.   Needs appointment my office 10-14 days for X-rays.   Electronic Signatures: Valinda HoarMiller, Delesa Kawa E (MD)  (Signed 30-Mar-16 12:33)  Authored: Chief Complaint, VITAL SIGNS/ANCILLARY NOTES, Brief Assessment, Assessment/Plan   Last Updated: 30-Mar-16 12:33 by Valinda HoarMiller, Voshon Petro E (MD)

## 2014-11-20 NOTE — Discharge Summary (Signed)
PATIENT NAME:  Desiree Roberts, Desiree Roberts MR#:  [id number] DATE OF BIRTH:  1931/12/08  DATE OF ADMISSION:  10/18/2014 DATE OF DISCHARGE:  10/20/2014  For a detailed note, please take a look at the history and physical done on admission by Dr. Angelica Ran.   DIAGNOSES AT DISCHARGE: As follows: Status post mechanical fall and left humeral fracture status post cast, chronic atrial fibrillation, hypokalemia, hypertension, diabetes.   DIET: The patient is being discharged on a low-sodium, low-fat, carbohydrate-controlled diet.   ACTIVITY: As tolerated.   FOLLOW-UP: Is with Dr. Deeann Roberts in the next 10 to 14 days. Also follow up with the patient's primary care physician in the next in 1 to 2 weeks.   DISCHARGE MEDICATIONS: Are as follows: Metformin 1000 mg 1-1/2 tablets in the evening and 1000 mg in the morning, melatonin 1 mg 1 to 2 tablets at bedtime, aspirin 325 mg daily, metoprolol tartrate 50 mg b.i.d., Tylenol with hydrocodone 5/325 one tablet q.6 h. as needed, and Tylenol 650 q.6 h. as needed.   CONSULTANTS DURING THE HOSPITAL COURSE: Dr. Deeann Roberts from orthopedics.   PERTINENT STUDIES DONE DURING THE HOSPITAL COURSE: Are as follows: A CT scan of the head done without contrast showing no acute intracranial process, no fracture or subluxation of the cervical spine, mild degenerative disk disease.   An x-ray of the abdomen showing unremarkable bowel gas pattern.   An x-ray of the left hip showing no acute fracture of the left hip.   X-ray of the left forearm showing comminuted distal radius fracture.   X-ray of the left humerus showing left proximal humerus surgical neck fracture.   HOSPITAL COURSE: This is an 81 year old female with medical problems as mentioned above, presented to the hospital due to syncope/fall and noted to have a left humeral fracture.   1. Syncope/fall. Most likely this was a mechanical fall, unlikely syncope. The patient's CT head on admission was negative. She had  mildly elevated cardiac markers, which were probably stress mediated. Her echocardiogram showed normal ejection fraction. She had no further evidence of any arrhythmias other than chronic atrial fibrillation, which she already has on telemetry. The patient was seen by physical therapy. They recommended short-term rehabilitation, which is where she is presently being discharged.  2. Left humeral fracture. This is secondary to a mechanical fall. The patient was seen by orthopedics. The patient underwent a cast to her left arm. She will be discharged on some oral pain control with Norco and Tylenol, as needed, and follow up with Dr. Deeann Roberts for x-rays in the next 10 to 14 days.  3. Chronic atrial fibrillation. Initially, when the patient presented to the hospital, her heart rates were uncontrolled. I increased her metoprolol dose, and her heart rates have improved with that. She will continue metoprolol 50 mg b.i.d. and continue aspirin for anticoagulation. She is a high fall risk; therefore, is not on long-term anticoagulation. Echocardiogram showed normal ejection fraction.  4. Elevated troponin. This was likely in the setting of demand ischemia from the uncontrolled atrial fibrillation and her fall. The patient's echocardiogram showed no wall motion abnormalities and normal ejection fraction.  5. Diabetes. While in the hospital, the patient was maintained on sliding scale insulin, although she will resume her metformin upon discharge.   CODE STATUS: The patient is a FULL CODE.   TIME SPENT ON DISCHARGE: 40 minutes.    ____________________________ Desiree Roberts. Cherlynn Kaiser, MD vjs:JT D: 10/20/2014 10:34:06 ET T: 10/20/2014 10:50:39 ET JOB#: [id number]  cc: Desiree PancakeVivek J. Cherlynn KaiserSainani, MD, <Dictator> Valinda HoarHoward E. Miller, MD Primary Care Physician Houston SirenVIVEK J SAINANI MD ELECTRONICALLY SIGNED 10/27/2014 16:22

## 2014-11-20 NOTE — Consult Note (Signed)
Brief Consult Note: Diagnosis: 1)  Left Colles fracture  2)  Left proximal humerus fracture.   Patient was seen by consultant.   Recommend to proceed with surgery or procedure.   Recommend further assessment or treatment.   Orders entered.   Comments: 81 year old female with history of prior CVA fell at home yesterday AM injuring the left shoulder and wrist.  Brought to Emergency Room last night where exam and X-rays show a minimally displaced left humerus surgical neck fracture and a slightly angulated left Colles fracture.   Exam:  Alert and cooerative.  circulation/sensation/motor function good left upper extremity.  Bruising around shoulder and wrist.  Pain on palpation and range of motion.  Skin intact.    X-rays: as above   Rx: Sling for shoulder and ice.       Will inject and manipulate wrist tomorrow.  Electronic Signatures: Valinda HoarMiller, Aunica Dauphinee E (MD)  (Signed 29-Mar-16 16:10)  Authored: Brief Consult Note   Last Updated: 29-Mar-16 16:10 by Valinda HoarMiller, Shacola Schussler E (MD)

## 2014-11-20 NOTE — H&P (Signed)
PATIENT NAME:  Desiree Roberts, [name] MR#:  [id number] DATE OF BIRTH:  10-21-1931  DATE OF ADMISSION:  10/18/2014  REFERRING PHYSICIAN: Enedina Finnerandolph N. Manson PasseyBrown, MD  PRIMARY CARE PHYSICIAN: Nonfocal, with [place].   CHIEF COMPLAINT: Unwitnessed fall.   HISTORY OF PRESENT ILLNESS: This is an 81 year old Caucasian female with past medical history of CVA, residual left-sided weakness, type 2 diabetes non-insulin requiring, essential hypertension, chronic atrial fibrillation, not on any anticoagulation, presenting after an unwitnessed fall. The patient is unable to provide any meaningful information given mental status and medical condition. History obtained from the family present at bedside. States that she had no preceding illness or symptoms over the last few days, however, they tried to reach her on the day of admission and were unsuccessful. Attempted multiple times to call her via the telephone, unsuccessful. Finally, the patient's neighbor went over, opened the door and found her on the floor, thus called EMS. On EMS's arrival she was complaining of left arm pain described only as pain, unable to further characterize, and oriented only to self at that time. In the Emergency Department noted to be in atrial fibrillation, rapid ventricular response, heart rate up into the 120 to 130 range. The patient still unable to provide any meaningful information given mental status and medical condition.   REVIEW OF SYSTEMS: Unobtainable given mental status and medical condition.   PAST MEDICAL HISTORY: Obtained from family at bedside include essential hypertension,  CVA, residual left-sided weakness, type 2 diabetes non-insulin requiring, atrial fibrillation chronic without anticoagulation.   SOCIAL HISTORY: No alcohol, tobacco, or drug usage. Does not use a cane or walker for ambulation, however, does have great difficulty ambulating at baseline.   FAMILY HISTORY: Positive for coronary artery disease.   ALLERGIES:  LATEX.   HOME MEDICATIONS: Include aspirin 325 mg p.o. q. daily, metformin 1000 mg in the morning, 1500 mg in the evening, metoprolol succinate 25 mg daily, melatonin 1 mg at bedtime as needed for sleep.   PHYSICAL EXAMINATION:  VITAL SIGNS: Temperature 97.9, heart rate 120,  respirations 25, blood pressure 168/86, saturating well on room air. Weight is 65.8 kg, BMI 24.9.  GENERAL: Well-nourished, well-developed, Caucasian female currently in minimal distress given mental status.  HEAD: Normocephalic, atraumatic.  EYES: Pupils equally round, reactive to light. Extraocular muscles unable to fully assess given mental status and medical condition. No scleral icterus.  MOUTH: Dry mucosal membranes. Dentition intact. No abscess noted.  EAR, NOSE, THROAT: Clear without exudates. No external lesions.  NECK: Supple. No thyromegaly. No nodules. No JVD.  PULMONARY: Clear to auscultation bilaterally without wheezes, rales, or rhonchi. No use of accessory muscles. Good respiratory effort.  CHEST: Nontender to palpation.  CARDIOVASCULAR: S1, S2, irregular rate, irregular rhythm. No murmurs, rubs, or gallops. No edema. Pedal pulses 2+ bilaterally. GASTROINTESTINAL: Soft, nontender, nondistended. No masses. Positive bowel sounds. No hepatosplenomegaly.  MUSCULOSKELETAL: No swelling, clubbing, or edema. Passive range of motion full in all extremities.  NEUROLOGIC: Unable to fully assess given patient's mental status and medical condition.  She is unable to follow simple commands at this time.   SKIN: Has area of ecchymosis over the proximal portion of left arm. No other lesions, rashes, or cyanosis. Left arm immobilized.  Skin warm and dry. Turgor intact.  PSYCHIATRIC: Unable to fully assess given patient's mental status and medical condition as she is currently somnolent and unable to follow simple commands.   LABORATORY DATA: Sodium of 136, potassium 3.1, chloride of 99, bicarbonate 23, BUN 25,  creatinine  0.64, glucose 202, troponin was 0.1. WBC of 16, hemoglobin 12, platelets of 192,000. Urinalysis negative for evidence of infection. EKG reveals atrial fibrillation with rapid ventricular response, heart rate in the 120s, no ST, T wave abnormalities. X-ray of the left arm reveals left proximal humerus acute surgical neck fracture. Left forearm reveals distal radius fracture with intra-articular extension. Chest x-ray performed: No acute cardiopulmonary process. CT head performed, which reveals no acute intracranial process. CT cervical spine: No acute findings.   ASSESSMENT AND PLAN: An 81 year old Caucasian female with history of hypertension, cerebrovascular accident, residual left-sided weakness, chronic atrial fibrillation presenting after an unwitnessed fall.  1.  Left proximal humerus fracture and distal radius fracture. We will consult orthopedics. She has already been splinted and immobilized in the Emergency Department. We will provide pain medications, initiate bowel regimen.  2.  Elevated troponin. Trend cardiac enzymes x 3. Place on telemetry. If upward trending, we will further anticoagulate.  3.  Atrial fibrillation with rapid ventricular response. Heart rate is better controlled in the ER.  We will place on telemetry with a goal heart rate less than 120.  4.  Hypokalemia. Replace potassium to goal of 4 to 5.  5.  Type 2 diabetes. Hold p.o. agents add  insulin sliding scale, q. 6 hour Accu-Cheks.   6.  Venous thromboembolic prophylaxis. Sequential compression devices.   CODE STATUS: The patient is full code.   TIME SPENT: 45 minutes.    ____________________________ Cletis Athens. Monica Zahler, MD dkh:AT D: 10/18/2014 02:43:59 ET T: 10/18/2014 03:02:20 ET JOB#: [id number]  cc: Cletis Athens. Tyshawna Alarid, MD, <Dictator> Ambrea Hegler Synetta Shadow MD ELECTRONICALLY SIGNED 10/18/2014 22:46

## 2015-12-02 ENCOUNTER — Encounter: Payer: Self-pay | Admitting: *Deleted

## 2015-12-02 ENCOUNTER — Ambulatory Visit
Admission: EM | Admit: 2015-12-02 | Discharge: 2015-12-02 | Disposition: A | Discharge status: Home / Self Care | Payer: Medicare Other | Attending: Family Medicine | Admitting: Family Medicine

## 2015-12-02 ENCOUNTER — Ambulatory Visit (INDEPENDENT_AMBULATORY_CARE_PROVIDER_SITE_OTHER): Payer: Medicare Other

## 2015-12-02 DIAGNOSIS — S62309A Unspecified fracture of unspecified metacarpal bone, initial encounter for closed fracture: Secondary | ICD-10-CM

## 2015-12-02 DIAGNOSIS — S92302A Fracture of unspecified metatarsal bone(s), left foot, initial encounter for closed fracture: Secondary | ICD-10-CM | POA: Diagnosis not present

## 2015-12-02 HISTORY — DX: Unspecified atrial fibrillation: I48.91

## 2015-12-02 HISTORY — DX: Cerebral infarction, unspecified: I63.9

## 2015-12-02 HISTORY — DX: Type 2 diabetes mellitus without complications: E11.9

## 2015-12-02 MED ORDER — ACETAMINOPHEN 500 MG PO TABS: 500.0000 mg | ORAL_TABLET | ORAL | Status: AC | PRN

## 2015-12-02 NOTE — Discharge Instructions (Signed)
Boxer's Knuckle °Boxer's knuckle is an injury to an extensor tendon. The extensor tendons are located on the back of the hand. They help the fingers to extend. They also help to protect the finger bones and joints. Boxer's knuckle develops if the layer of tissue that lies over these tendons becomes damaged and causes a tendon to move out of position. Boxer's knuckle often affects the first knuckle of the middle finger. °CAUSES °This condition is caused by direct or repeated injury (trauma) to a knuckle. If often happens during activities such boxing or martial arts.  °RISK FACTORS °This condition is more likely to develop in: °· People who participate in hitting or fighting sports, such as boxing and martial arts. °· People who play contact sports, such as football and rugby. °· People who have poor strength and flexibility. °· People who have injured a knuckle. °SYMPTOMS  °Symptoms of this condition include: °· Pain and swelling over the injured knuckle. °· Difficulty straightening the affected finger. °· Delay when you try to straighten the affected finger. °· Tenderness when you touch the injured knuckle. °· Abnormal movement of the affected tendon when you open and close your hand. °DIAGNOSIS °This condition is diagnosed with a physical exam. Sometimes, X-rays are taken to check for additional problems, such as a fracture or cyst in the bone under the injured area. °TREATMENT °This condition may be treated with: °· Ice applied to the affected area. °· Medicines for pain. °· Placing the hand in a cast or splint to keep the injured joint from moving while the tendon heals. °· Surgery to repair the injured tendon or tissue. This may be done in severe cases. °HOME CARE INSTRUCTIONS °If You Have a Cast: °· Do not stick anything inside the cast to scratch your skin. Doing that increases your risk of infection. °· Check the skin around the cast every day. Report any concerns to your health care provider. You may put  lotion on dry skin around the edges of the cast. Do not apply lotion to the skin underneath the cast. °· Keep the cast clean and dry. °If You Have a Splint: °· Wear it as told by your health care provider. Remove it only as told by your health care provider. °· Loosen the splint if your fingers become numb and tingle, or if they turn cold and blue. °· Keep the splint clean and dry. °Bathing °· Do not take baths, swim, or use a hot tub until your health care provider approves. Ask your health care provider if you can take showers. You may only be allowed to take sponge baths for bathing. °· If your health care provider approves bathing and showering, cover the cast or splint with a watertight plastic bag to protect it from water. Do not let the cast or splint get wet. °Managing Pain, Stiffness, and Swelling °· If directed, apply ice to the injured area. °¨ Put ice in a plastic bag. °¨ Place a towel between your skin and the bag. °¨ Leave the ice on for 20 minutes, 2-3 times per day. °Driving °· Do not drive or operate heavy machinery while taking prescription pain medicine. °· Ask your health care provider when it is safe to drive if you have a cast or splint on your hand. °General Instructions °· Do not put pressure on any part of the cast or splint until it is fully hardened. This may take several hours. °· Do not use any tobacco products, including cigarettes, chewing tobacco,   or e-cigarettes. Tobacco can delay bone healing. If you need help quitting, ask your health care provider.  Take over-the-counter and prescription medicines only as told by your health care provider.  Keep all follow-up visits as told by your health care provider. This is important. SEEK MEDICAL CARE IF:  Your pain gets worse.  You hand tingles or feels numb.  Your hand becomes discolored.   This information is not intended to replace advice given to you by your health care provider. Make sure you discuss any questions you have  with your health care provider.   Document Released: 07/08/2005 Document Revised: 03/29/2015 Document Reviewed: 09/08/2014 Elsevier Interactive Patient Education 2016 [employer]  Closed Reduction for Metacarpal Fracture or Dislocation A closed reduction is a procedure to properly align bones that have become misaligned because of an injury. The term "closed" indicates that the procedure is done without cutting your skin open to manipulate your fracture or dislocation. A metacarpal fracture is a fracture of one of the bones that make up the palm area of your hand (metacarpal). A metacarpal dislocation is a dislocation of a metacarpal bone and one of the bones at the base of your fingers at the joint where they meet. LET [facility] CARE PROVIDER KNOW ABOUT:  Any allergies you have.  Any medicines you are taking, including vitamins, herbs, eye drops, over-the-counter medicines, and creams.  Previous problems that you or members of your family have had with the use of anesthetics.  Any other areas of your hand, arm, or shoulder that are tender or painful. RISKS AND COMPLICATIONS Generally closed reduction is a safe procedure. However, problems can sometimes occur, such as:  Reaction to the anesthetic or other drugs used during the procedure.  Damage to surrounding nerves, tissues, or structures around the injured area.  Failure of the fracture to heal (nonunion).  Healing of the fracture in an abnormal position (malunion). BEFORE THE PROCEDURE You may receive the following exams before your procedure:  A physical exam and medical history.  Imaging tests, such as an X-ray exam or computed tomography (CT). PROCEDURE Medicine to numb your injured area (local anesthetic) may be applied. Often, a medicine to make you relax (sedative) will be given. These medicines will keep you from feeling severe pain during the procedure. The surgeon will pull and push on your misaligned bones to put  them back in place. Then a splint will be applied to the area. You will wear the splint for about 3-4 weeks to keep the finger immobilized. Initially, a cast may be applied to the area if you had a fracture. After 4-6 weeks, the cast may be replaced by a removable splint. AFTER THE PROCEDURE After the procedure, you may have an X-ray exam to check on the position of the reduced bone or joint. You will wear your splint or cast for about 3-6 weeks to keep the injured finger immobilized.    This information is not intended to replace advice given to you by your health care provider. Make sure you discuss any questions you have with your health care provider.   Document Released: 04/01/2012 Document Revised: 07/29/2014 Document Reviewed: 04/01/2012 Elsevier Interactive Patient Education 2016 [employer]  Boxer's Knuckle Boxer's knuckle is an injury to an extensor tendon. The extensor tendons are located on the back of the hand. They help the fingers to extend. They also help to protect the finger bones and joints. Boxer's knuckle develops if the layer of tissue that lies  over these tendons becomes damaged and causes a tendon to move out of position. Boxer's knuckle often affects the first knuckle of the middle finger. CAUSES This condition is caused by direct or repeated injury (trauma) to a knuckle. If often happens during activities such boxing or martial arts.  RISK FACTORS This condition is more likely to develop in:  People who participate in hitting or fighting sports, such as boxing and martial arts.  People who play contact sports, such as football and rugby.  People who have poor strength and flexibility.  People who have injured a knuckle. SYMPTOMS  Symptoms of this condition include:  Pain and swelling over the injured knuckle.  Difficulty straightening the affected finger.  Delay when you try to straighten the affected finger.  Tenderness when you touch the injured  knuckle.  Abnormal movement of the affected tendon when you open and close your hand. DIAGNOSIS This condition is diagnosed with a physical exam. Sometimes, X-rays are taken to check for additional problems, such as a fracture or cyst in the bone under the injured area. TREATMENT This condition may be treated with:  Ice applied to the affected area.  Medicines for pain.  Placing the hand in a cast or splint to keep the injured joint from moving while the tendon heals.  Surgery to repair the injured tendon or tissue. This may be done in severe cases. HOME CARE INSTRUCTIONS If You Have a Cast:  Do not stick anything inside the cast to scratch your skin. Doing that increases your risk of infection.  Check the skin around the cast every day. Report any concerns to your health care provider. You may put lotion on dry skin around the edges of the cast. Do not apply lotion to the skin underneath the cast.  Keep the cast clean and dry. If You Have a Splint:  Wear it as told by your health care provider. Remove it only as told by your health care provider.  Loosen the splint if your fingers become numb and tingle, or if they turn cold and blue.  Keep the splint clean and dry. Bathing  Do not take baths, swim, or use a hot tub until your health care provider approves. Ask your health care provider if you can take showers. You may only be allowed to take sponge baths for bathing.  If your health care provider approves bathing and showering, cover the cast or splint with a watertight plastic bag to protect it from water. Do not let the cast or splint get wet. Managing Pain, Stiffness, and Swelling  If directed, apply ice to the injured area.  Put ice in a plastic bag.  Place a towel between your skin and the bag.  Leave the ice on for 20 minutes, 2-3 times per day. Driving  Do not drive or operate heavy machinery while taking prescription pain medicine.  Ask your health care  provider when it is safe to drive if you have a cast or splint on your hand. General Instructions  Do not put pressure on any part of the cast or splint until it is fully hardened. This may take several hours.  Do not use any tobacco products, including cigarettes, chewing tobacco, or e-cigarettes. Tobacco can delay bone healing. If you need help quitting, ask your health care provider.  Take over-the-counter and prescription medicines only as told by your health care provider.  Keep all follow-up visits as told by your health care provider. This is important. SEEK  MEDICAL CARE IF:  Your pain gets worse.  You hand tingles or feels numb.  Your hand becomes discolored.  Cryotherapy Cryotherapy means treatment with cold. Ice or gel packs can be used to reduce both pain and swelling. Ice is the most helpful within the first 24 to 48 hours after an injury or flare-up from overusing a muscle or joint. Sprains, strains, spasms, burning pain, shooting pain, and aches can all be eased with ice. Ice can also be used when recovering from surgery. Ice is effective, has very few side effects, and is safe for most people to use. PRECAUTIONS  Ice is not a safe treatment option for people with: Raynaud phenomenon. This is a condition affecting small blood vessels in the extremities. Exposure to cold may cause your problems to return. Cold hypersensitivity. There are many forms of cold hypersensitivity, including: Cold urticaria. Red, itchy hives appear on the skin when the tissues begin to warm after being iced. Cold erythema. This is a red, itchy rash caused by exposure to cold. Cold hemoglobinuria. Red blood cells break down when the tissues begin to warm after being iced. The hemoglobin that carry oxygen are passed into the urine because they cannot combine with blood proteins fast enough. Numbness or altered sensitivity in the area being iced. If you have any of the following conditions, do not use  ice until you have discussed cryotherapy with your caregiver: Heart conditions, such as arrhythmia, angina, or chronic heart disease. High blood pressure. Healing wounds or open skin in the area being iced. Current infections. Rheumatoid arthritis. Poor circulation. Diabetes. Ice slows the blood flow in the region it is applied. This is beneficial when trying to stop inflamed tissues from spreading irritating chemicals to surrounding tissues. However, if you expose your skin to cold temperatures for too long or without the proper protection, you can damage your skin or nerves. Watch for signs of skin damage due to cold. HOME CARE INSTRUCTIONS Follow these tips to use ice and cold packs safely. Place a dry or damp towel between the ice and skin. A damp towel will cool the skin more quickly, so you may need to shorten the time that the ice is used. For a more rapid response, add gentle compression to the ice. Ice for no more than 10 to 20 minutes at a time. The bonier the area you are icing, the less time it will take to get the benefits of ice. Check your skin after 5 minutes to make sure there are no signs of a poor response to cold or skin damage. Rest 20 minutes or more between uses. Once your skin is numb, you can end your treatment. You can test numbness by very lightly touching your skin. The touch should be so light that you do not see the skin dimple from the pressure of your fingertip. When using ice, most people will feel these normal sensations in this order: cold, burning, aching, and numbness. Do not use ice on someone who cannot communicate their responses to pain, such as small children or people with dementia. HOW TO MAKE AN ICE PACK Ice packs are the most common way to use ice therapy. Other methods include ice massage, ice baths, and cryosprays. Muscle creams that cause a cold, tingly feeling do not offer the same benefits that ice offers and should not be used as a substitute  unless recommended by your caregiver. To make an ice pack, do one of the following: Place crushed ice or  a bag of frozen vegetables in a sealable plastic bag. Squeeze out the excess air. Place this bag inside another plastic bag. Slide the bag into a pillowcase or place a damp towel between your skin and the bag. Mix 3 parts water with 1 part rubbing alcohol. Freeze the mixture in a sealable plastic bag. When you remove the mixture from the freezer, it will be slushy. Squeeze out the excess air. Place this bag inside another plastic bag. Slide the bag into a pillowcase or place a damp towel between your skin and the bag. SEEK MEDICAL CARE IF: You develop white spots on your skin. This may give the skin a blotchy (mottled) appearance. Your skin turns blue or pale. Your skin becomes waxy or hard. Your swelling gets worse. MAKE SURE YOU:  Understand these instructions. Will watch your condition. Will get help right away if you are not doing well or get worse.   This information is not intended to replace advice given to you by your health care provider. Make sure you discuss any questions you have with your health care provider.   Document Released: 03/04/2011 Document Revised: 07/29/2014 Document Reviewed: 03/04/2011 Elsevier Interactive Patient Education 2016 [employer] Metatarsal Fracture With Rehab A metatarsal fracture is a broken bone in one of the five bones that connect your toes to the rest of your foot (forefoot fracture). Metatarsals are long bones that can be stressed or cracked easily. A metatarsal fracture can be: A stress fracture. Stress fractures are cracks in the surface of the metatarsal bone. Athletes often get stress fractures. A complete fracture. A complete fracture goes all the way through the bone. The bone that connects to the pinky toe (fifth metatarsal) is the most commonly fractured metatarsal. Ballet dancers often fracture this bone. CAUSES  Stress fractures may  be caused by: Poor training technique. Sudden increase in activity. Changing your activity to a harder surface. Wearing athletic shoes that do not have enough cushioning. Complete fractures are usually caused by: Dropping a heavy object on your foot. An injury that severely twists your foot. SIGNS AND SYMPTOMS The most common symptom of a stress fracture is foot pain that goes away with rest. The most common symptom of a complete fracture is intense pain that persists after the injury. Other symptoms of both fracture types include: Bruising. Swelling. Pain with movement or putting weight on the foot. Tenderness or pain with pressure. Trouble walking. DIAGNOSIS  Your health care provider may suspect a metatarsal fracture based on your symptoms and medical history. Your health care provider will also do a physical exam. During the physical exam, your health care provider may try to move your foot and toes to check for pain and limited movement. Your foot will also be checked for: Bruising. Tenderness. Swelling. Deformity. Other tests that may be done include: X-rays. X-rays are able to show most fractures. A bone scan. This test may be necessary to show a stress fracture. TREATMENT  Treatment for a metatarsal fracture depends on how severe the fracture was and the type of fracture. Treatment may include: Surgery. Surgery is usually needed to repair a displaced fracture. A displaced fracture happens when pieces of the broken bone are moved out of place (displacement). After surgery, you may need to wear a short walking cast for 6 to 8 weeks. Medicines. Medicines may be used to reduce swelling and pain. Physical therapy. Physical therapy may last for several months. Use of a supportive device, such as: Elastic wrap.  Splint. Boot. Cast. Crutches to support weight on your foot until the broken bone heals. You can usually treat a stress fracture or a nondisplaced fracture with:   Rest. Ice. Elevation. Support. HOME CARE INSTRUCTIONS Follow all your health care provider's instructions. Take medicine only as directed by your health care provider. Rest your foot until your health care provider says you can resume your usual activities. When resting, keep your foot raised above the level of your heart (elevated). Ice may help reduce pain and swelling. Place ice in a plastic bag. Place a towel between your skin and the bag. Leave the ice on for 20 minutes, 2-3 times a day. Wear your supportive device as directed. Do not get your cast or splint wet. Keep all follow-up visits as directed by your health care provider. If your health care provider recommended physical therapy, it is very important for proper healing of your injury that you keep all visits. PREVENTION  After your fracture has healed, you can prevent another fracture by: Starting new sports activities gradually. Cross training to avoid putting stress on the same part of your foot every day (such as alternate running with swimming or biking). Eat a healthy diet that includes plenty of calcium and vitamin D. Wear the right athletic shoes for your sport. Replace them when they wear out. Stop your activity or training if you have pain or swelling in your foot. Rest for a few days. SEEK MEDICAL CARE IF: You have pain that is getting worse. You develop chills or fever. Your foot feels numb. Your cast or splint is damaged. You notice swelling or redness below your cast or splint. WHAT REHABILITATION EXERCISES CAN I DO AT HOME? Ask your health care provider or physical therapist when you can start doing exercises at home. Doing these exercises 3-5 times a week can help you regain strength and flexibility. If doing any of these exercises causes pain, stop and contact your health care provider or physical therapist. Heel Cord Stretch Do 2 sets of 10 repetitions. Stand facing a wall with your healthy foot  forward and your knee slightly bent. Place both hands on the wall for support. Stretch your healing foot out straight behind you. Keep both heels on the floor. Hold the stretch for 30 seconds. You can also do this exercise with both knees slightly bent. Repeat the same steps. Golf [employer] Do this once every day. Sit on a chair and roll a golf ball under your healing foot for two minutes. Towel Stretch Sit on the floor with your legs out straight. Loop a towel around the front of your healing foot. Use both hands to pull the ends of the towel, stretching your foot back toward your body. Hold the stretch for 30 seconds and repeat 3 times. Calf Raises Do 2 sets of 10 repetitions. Stand behind a chair and use your hands to support you. Lift your good foot off the ground. Rise up on the front of your healing foot as high as you can. Repeat the lift 10 times. Marble Pickup Do this once a day. Sit in a chair and put 20 marbles on the floor in front of you. Use the toes of your healing foot to pick up each marble. Towel Curls Repeat this exercise 5 times. Sit in a chair and place a small towel on the floor in front of you. Use the toes of your healing foot to grab the towel and pull it toward you.   This  information is not intended to replace advice given to you by your health care provider. Make sure you discuss any questions you have with your health care provider.   Document Released: 07/08/2005 Document Revised: 11/22/2014 Document Reviewed: 10/21/2013 Elsevier Interactive Patient Education [employer]  This information is not intended to replace advice given to you by your health care provider. Make sure you discuss any questions you have with your health care provider.   Document Released: 07/08/2005 Document Revised: 03/29/2015 Document Reviewed: 09/08/2014 [employer] Interactive Patient Education [employer]

## 2015-12-02 NOTE — ED Provider Notes (Signed)
CSN: [id number]     Arrival date & time 12/02/15  1204 History   First MD Initiated Contact with Patient 12/02/15 1249     Chief Complaint  Patient presents with  . Fall   (Consider location/radiation/quality/duration/timing/severity/associated sxs/prior Treatment) HPI Comments: Single caucasian female here for evaluation of left hand and foot pain s/p fall on carpet at home "balance off since stroke 5 years ago utilizes cane"  Was trying to keep cat from getting outside.  Left hand and foot bruised and swollen mild pain.  Daughter here to get patient xrays as had broken arm last year after fall.  Patient is a 82 y.o. female presenting with fall. The history is provided by the patient and a relative.  Fall This is a recurrent problem. The current episode started more than 2 days ago. The problem occurs rarely. The problem has been gradually improving. Pertinent negatives include no chest pain, no abdominal pain, no headaches and no shortness of breath. The symptoms are aggravated by walking and exertion. The symptoms are relieved by rest. She has tried rest, food and water for the symptoms. The treatment provided mild relief.    Past Medical History  Diagnosis Date  . Diabetes mellitus without complication (HCC)   . Stroke (HCC)   . Atrial fibrillation (HCC)    History reviewed. No pertinent past surgical history. History reviewed. No pertinent family history. Social History  Substance Use Topics  . Smoking status: Never Smoker   . Smokeless tobacco: None  . Alcohol Use: No   OB History    No data available     Review of Systems  Constitutional: Negative for fever, chills, activity change and appetite change.  HENT: Negative for congestion, ear pain and sore throat.   Eyes: Negative for pain and discharge.  Respiratory: Negative for cough, shortness of breath and wheezing.   Cardiovascular: Negative for chest pain and leg swelling.  Gastrointestinal: Negative for nausea,  vomiting, abdominal pain, diarrhea, constipation and blood in stool.  Genitourinary: Negative for dysuria, hematuria and difficulty urinating.  Musculoskeletal: Positive for myalgias, joint swelling, arthralgias and gait problem. Negative for back pain, neck pain and neck stiffness.  Skin: Positive for color change. Negative for pallor, rash and wound.  Allergic/Immunologic: Negative for environmental allergies and food allergies.  Neurological: Negative for dizziness, tremors, seizures, syncope, facial asymmetry, speech difficulty, weakness, light-headedness, numbness and headaches.  Hematological: Negative for adenopathy. Does not bruise/bleed easily.  Psychiatric/Behavioral: Negative for confusion, sleep disturbance and agitation. The patient is not nervous/anxious.     Allergies  Review of patient's allergies indicates no known allergies.  Home Medications   Prior to Admission medications   Medication Sig Start Date End Date Taking? Authorizing Provider  aspirin 325 MG tablet Take 325 mg by mouth daily.   Yes Historical Provider, MD  metFORMIN (GLUMETZA) 1000 MG (MOD) 24 hr tablet Take 1,000 mg by mouth daily with breakfast.   Yes Historical Provider, MD  metoprolol succinate (TOPROL-XL) 25 MG 24 hr tablet Take 25 mg by mouth daily.   Yes Historical Provider, MD  acetaminophen (TYLENOL) 500 MG tablet Take 1 tablet (500 mg total) by mouth every 4 (four) hours as needed for mild pain or moderate pain. 12/02/15 12/08/15  Barbaraann Barthel, NP   Meds Ordered and Administered this Visit  Medications - No data to display  BP 194/105 [id number]  Temp(Src) 97.9 F (36.6 C) (Oral)  Resp 16  Ht  (1.676 m)  Wt 135 lb (61.236 kg)  BMI 21.80 kg/m2  SpO2 99% No data found.   Physical Exam  Constitutional: She is oriented to person, place, and time. She appears well-developed and well-nourished. She is active and cooperative.  Non-toxic appearance. She does not have a sickly  appearance. She appears ill. No distress.  HENT:  Head: Normocephalic and atraumatic.  Right Ear: Hearing, external ear and ear canal normal. A middle ear effusion is present.  Left Ear: Hearing, external ear and ear canal normal. A middle ear effusion is present.  Nose: Mucosal edema and rhinorrhea present. No nose lacerations, sinus tenderness, nasal deformity, septal deviation or nasal septal hematoma. No epistaxis.  No foreign bodies. Right sinus exhibits maxillary sinus tenderness and frontal sinus tenderness. Left sinus exhibits no maxillary sinus tenderness and no frontal sinus tenderness.  Mouth/Throat: Uvula is midline and mucous membranes are normal. Mucous membranes are not pale, not dry and not cyanotic. She does not have dentures. No oral lesions. No trismus in the jaw. Normal dentition. No dental abscesses, uvula swelling, lacerations or dental caries. Posterior oropharyngeal edema and posterior oropharyngeal erythema present. No oropharyngeal exudate or tonsillar abscesses.  Eyes: Conjunctivae, EOM and lids are normal. Pupils are equal, round, and reactive to light. Right eye exhibits no chemosis, no discharge, no exudate and no hordeolum. No foreign body present in the right eye. Left eye exhibits no chemosis, no discharge, no exudate and no hordeolum. No foreign body present in the left eye. Right conjunctiva is not injected. Right conjunctiva has no hemorrhage. Left conjunctiva is not injected. Left conjunctiva has no hemorrhage. No scleral icterus. Right eye exhibits normal extraocular motion and no nystagmus. Left eye exhibits normal extraocular motion and no nystagmus. Right pupil is round and reactive. Left pupil is round and reactive. Pupils are equal.  Neck: Trachea normal and normal range of motion. Neck supple. No tracheal tenderness, no spinous process tenderness and no muscular tenderness present. No rigidity. No tracheal deviation, no edema, no erythema and normal range of motion  present. No thyroid mass and no thyromegaly present.  Cardiovascular: Normal rate, regular rhythm, S1 normal, S2 normal, normal heart sounds and intact distal pulses.  PMI is not displaced.  Exam reveals no gallop and no friction rub.   No murmur heard. Pulmonary/Chest: Effort normal and breath sounds normal. No accessory muscle usage or stridor. No respiratory distress. She has no decreased breath sounds. She has no wheezes. She has no rhonchi. She has no rales. She exhibits no tenderness.  Abdominal: Soft. She exhibits no distension.  Musculoskeletal: Normal range of motion. She exhibits edema and tenderness.       Right shoulder: Normal.       Left shoulder: Normal.       Right hip: Normal.       Left hip: Normal.       Right knee: Normal.       Left knee: Normal.       Right ankle: Normal.       Left ankle: She exhibits swelling and ecchymosis. She exhibits normal range of motion, no deformity, no laceration and normal pulse. Tenderness. Lateral malleolus, medial malleolus, head of 5th metatarsal and proximal fibula tenderness found. Achilles tendon normal.       Cervical back: Normal.       Right hand: Normal.       Left hand: She exhibits tenderness, bony tenderness and swelling. She exhibits normal range of motion, normal capillary refill, no deformity  and no laceration. Normal sensation noted. Normal strength noted.       Hands:      Right lower leg: Normal.       Left lower leg: Normal.       Right foot: Normal.       Left foot: There is tenderness, bony tenderness and swelling. There is normal range of motion, normal capillary refill, no crepitus, no deformity and no laceration.       Feet:  Right hand dominant uses cane left hand with ambulating  Lymphadenopathy:       Head (right side): No submental, no submandibular, no tonsillar, no preauricular, no posterior auricular and no occipital adenopathy present.       Head (left side): No submental, no submandibular, no tonsillar,  no preauricular, no posterior auricular and no occipital adenopathy present.    She has no cervical adenopathy.       Right cervical: No superficial cervical, no deep cervical and no posterior cervical adenopathy present.      Left cervical: No superficial cervical, no deep cervical and no posterior cervical adenopathy present.  Neurological: She is alert and oriented to person, place, and time. She has normal strength. She is not disoriented. She displays no atrophy, no tremor and normal reflexes. No cranial nerve deficit or sensory deficit. She exhibits normal muscle tone. She displays no seizure activity. Coordination and gait normal. GCS eye subscore is 4. GCS verbal subscore is 5. GCS motor subscore is 6.  Skin: Skin is warm, dry and intact. Bruising and ecchymosis noted. No abrasion, no burn, no laceration, no lesion, no petechiae and no rash noted. She is not diaphoretic. No cyanosis or erythema. No pallor. Nails show no clubbing.  Psychiatric: She has a normal mood and affect. Her speech is normal and behavior is normal. Judgment and thought content normal. Cognition and memory are impaired.  Nursing note and vitals reviewed.   ED Course  Procedures (including critical care time)  Labs Review Labs Reviewed - No data to display  Imaging Review Dg Hand Complete Left  12/02/2015  CLINICAL DATA:  Fall 4 days ago with distal anterior left foot pain and bruising as well as posterior left hand pain medially. History of previous fifth metacarpal fracture and wrist fracture 2016. EXAM: LEFT HAND - COMPLETE 3+ VIEW COMPARISON:  None. FINDINGS: Examination demonstrates a minimally displaced oblique fracture of the fifth metacarpal. Mild volar angulation of the distal fragment. Evidence of patient's old distal radial fracture. Mild degenerative changes over the wrist and interphalangeal joints. IMPRESSION: Minimally displaced oblique fracture of the fifth metacarpal. Electronically Signed   By: Elberta Fortis M.D.   On: 12/02/2015 14:05   Dg Foot Complete Left  12/02/2015  CLINICAL DATA:  Fall 4 days ago with distal anterior/dorsal pain and bruising left foot. EXAM: LEFT FOOT - COMPLETE 3+ VIEW COMPARISON:  None. FINDINGS: Examination demonstrates very minimally displaced fractures involving the heads of the metatarsals 2 through 4. There mild degenerate changes over the interphalangeal joints and midfoot. Small vessel atherosclerotic disease is present. There is a small inferior calcaneal spur. IMPRESSION: Minimally displaced fractures involving the head of the metatarsals 2 through 4. Electronically Signed   By: Elberta Fortis M.D.   On: 12/02/2015 14:01   1412 discussed xray results with patient and daughter metacarpal and metatarsal fractures; discussed minimal weight bearing left foot.  She prefers to see Dr Gavin Potters in [facility] will call Monday to schedule appt.  She has  appt with primary care Dr Gavin Potters Tuesday next week.  Discussed no lifting with left hand.  Want to avoid shifting of fractured bones which could delay healing or than require surgery. Copy of radiology reports given to patient.   Patient and daughter verbalized understanding of information/instructions and agreed with plan of care.  1450 Ulnar gutter splint in place with arm sling and left cam walker fitted and distributed from clinic stock by [employer].  Pending repeat BP as patient reports white coat hypertension stated in very little pain at this time.  BP improved from previous but still elevated.  Take blood pressure medications as prescribed by PCM.  Refused pain medication again at this time.  Ice/elevate/rest do not recommend shopping excursions this afternoon or prolonged walking.  Patient and daughter verbalized understanding of information/instructions, agreed with plan of care and had no further questions at this time.   1500 images on disk given to patient by [facility] radiology tech for follow up appt with Dr Gavin Potters.    Filed Vitals:   12/02/15 1227 12/02/15 1451  BP: 194/105 175/102  Pulse: 76 78  Temp: 97.9 F (36.6 C)   TempSrc: Oral   Resp: 16 16  Height: 5\' 6"  (1.676 m)   Weight: 135 lb (61.236 kg)   SpO2: 99% 100%   MDM   1. Metacarpal bone fracture, closed, initial encounter   2. Closed fracture of head of metatarsal bone of left foot, initial encounter     Recommended multivitamin with calcium and vitamin D once a day as fracture found to help improve healing.  Ice/elevate/rest extremity this weekend. Tylenol 500mg  po q4h prn pain max 4000mg  po daily and 1000mg  per dose.  Ice elevate foot and hand.  Minimal use/weight bearing/no lifting with left hand.  Use arm sling prn comfort may remove for shoulder AROM.  Call Dr Gavin Potters on Monday to schedule appt (ortho) her preferred provider.  Patient stated she could not use crutches already uses cane for ambulation and does not want walker as difficulty utilizing it after stroke 5 years ago caused more falls/instability.  I suspect moderate risk of prolonged impairment due to this injury due to age and multiple fractures and history of frequent falls which could complicate this injury.  Family support system in place but patient noncompliant at times with plan of care.  Patient can achieve full recovery without long term deficits. Patient agreed with plan of care and had no further questions at this time.     Barbaraann Barthel, NP 12/02/15 1502

## 2015-12-02 NOTE — ED Notes (Signed)
Pt fell on Wednesday, lost balance and states "let myself down" , "not a hard fall". Now c/o left hand and left foot pain. No edema or deformity.

## 2017-10-20 DEATH — deceased
